# Patient Record
Sex: Male | Born: 1995 | Race: Black or African American | Hispanic: No | Marital: Single | Smoking: Current every day smoker
Health system: Southern US, Community
[De-identification: ages and names within clinical notes are randomized; demographics above are authoritative.]

---
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
## 2022-05-19 DIAGNOSIS — Z419 Encounter for procedure for purposes other than remedying health state, unspecified: Secondary | ICD-10-CM | POA: Diagnosis not present

## 2022-06-19 DIAGNOSIS — Z419 Encounter for procedure for purposes other than remedying health state, unspecified: Secondary | ICD-10-CM | POA: Diagnosis not present

## 2022-07-20 DIAGNOSIS — Z419 Encounter for procedure for purposes other than remedying health state, unspecified: Secondary | ICD-10-CM | POA: Diagnosis not present
